# Patient Record
Sex: Male | Born: 2000 | Race: White | Hispanic: No | Marital: Single | Smoking: Never smoker
Health system: Southern US, Community
[De-identification: ages and names within clinical notes are randomized; demographics above are authoritative.]

## PROBLEM LIST (undated history)

## (undated) DIAGNOSIS — L309 Dermatitis, unspecified: Secondary | ICD-10-CM

## (undated) DIAGNOSIS — J45909 Unspecified asthma, uncomplicated: Secondary | ICD-10-CM

## (undated) DIAGNOSIS — J309 Allergic rhinitis, unspecified: Secondary | ICD-10-CM

## (undated) HISTORY — PX: CLEFT LIP REPAIR: SUR1164

## (undated) HISTORY — DX: Dermatitis, unspecified: L30.9

## (undated) HISTORY — DX: Allergic rhinitis, unspecified: J30.9

## (undated) HISTORY — DX: Unspecified asthma, uncomplicated: J45.909

---
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
## 2022-11-02 ENCOUNTER — Encounter (HOSPITAL_COMMUNITY): Payer: Self-pay | Admitting: Emergency Medicine

## 2022-11-02 ENCOUNTER — Ambulatory Visit (INDEPENDENT_AMBULATORY_CARE_PROVIDER_SITE_OTHER): Payer: Managed Care, Other (non HMO)

## 2022-11-02 ENCOUNTER — Ambulatory Visit (HOSPITAL_COMMUNITY)
Admission: EM | Admit: 2022-11-02 | Discharge: 2022-11-02 | Disposition: A | Discharge status: Home / Self Care | Payer: Managed Care, Other (non HMO) | Attending: Physician Assistant | Admitting: Physician Assistant

## 2022-11-02 DIAGNOSIS — J40 Bronchitis, not specified as acute or chronic: Secondary | ICD-10-CM | POA: Diagnosis not present

## 2022-11-02 MED ORDER — PROMETHAZINE-DM 6.25-15 MG/5ML PO SYRP: 5.0000 mL | ORAL_SOLUTION | Freq: Four times a day (QID) | ORAL | 0 refills | Status: AC | PRN

## 2022-11-02 MED ORDER — ALBUTEROL SULFATE HFA 108 (90 BASE) MCG/ACT IN AERS: 1.0000 | INHALATION_SPRAY | Freq: Four times a day (QID) | RESPIRATORY_TRACT | 0 refills | Status: AC | PRN

## 2022-11-02 NOTE — Discharge Instructions (Addendum)
Your chest xray is normal Use cough syrup as needed Use inhaler as needed If you develop new or worsening symptoms please return for evaluation.

## 2022-11-02 NOTE — ED Triage Notes (Signed)
Pt reports that he has cough for a couple weeks. Phlegm is white to yellowish. Reports on Wednesday coughed up bright red blood in phlegm. Denies taking medications for cough.

## 2022-11-02 NOTE — ED Provider Notes (Signed)
[facility]    CSN: [id number] Arrival date & time: 11/02/22  1330      History   Chief Complaint Chief Complaint  Patient presents with   Cough    HPI Jesse Marsh is a 22 y.o. male.   Pt complains of productive cough that started about 2 weeks ago.  Denies fever, chills, night sweats.  He reports several days ago he had blood tinged sputum.  He does vape, has stopped since he saw blood in sputum.  He has a h/o asthma.  Reports some wheezing, but is out of his inhaler at this time.  Denies taking anything else for cough. Denies sick contacts.     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 Authorizing Provider  albuterol (VENTOLIN HFA) 108 (90 Base) MCG/ACT inhaler Inhale 1-2 puffs into the lungs every 6 (six) hours as needed for wheezing or shortness of breath. 11/02/22  Yes Ward, Tylene Fantasia, PA-C  promethazine-dextromethorphan (PROMETHAZINE-DM) 6.25-15 MG/5ML syrup Take 5 mLs by mouth 4 (four) times daily as needed for cough. 11/02/22  Yes Ward, Tylene Fantasia, PA-C  aspirin-acetaminophen-caffeine (EXCEDRIN MIGRAINE) [id number] MG tablet Take 1 tablet by mouth every 6 (six) hours as needed for headache.    [provider]  beclomethasone (QVAR REDIHALER) 80 MCG/ACT inhaler Inhale 2 puffs into the lungs 2 (two) times daily. 02/01/19   Hilts, Casimiro Needle, MD   benzonatate (TESSALON) 100 MG capsule Take 1 capsule (100 mg total) by mouth every 8 (eight) hours. 02/24/20   Hall-Potvin, [place], PA-C  cetirizine (ZYRTEC) 10 MG tablet TAKE 1 TABLET (10 MG TOTAL) BY MOUTH DAILY AS NEEDED. 01/15/16   Kozlow, Alvira Philips, MD  ondansetron (ZOFRAN) 4 MG tablet Take 1 tablet (4 mg total) by mouth every 8 (eight) hours as needed for nausea or vomiting. 03/06/20   Eustaquio Boyden, MD    Family History Family History  Problem Relation Age of Onset   Eczema Mother    Asthma Father    Diabetes Maternal Grandmother    Asthma Maternal Grandmother    Diabetes Paternal Grandmother    Prostate cancer Paternal Grandfather    CAD Neg Hx    Stroke Neg Hx     Social History Social History   Tobacco Use   Smoking status: Never   Smokeless tobacco: Never  Vaping Use   Vaping Use: Never used  Substance Use Topics   Alcohol use: No   Drug use: No     Allergies   Patient has no known allergies.   Review of Systems Review of Systems  Constitutional:  Negative for chills and fever.  HENT:  Negative for ear pain and sore throat.   Eyes:  Negative for pain and visual disturbance.  Respiratory:  Positive for cough and wheezing. Negative for shortness of breath.   Cardiovascular:  Negative for chest pain and palpitations.  Gastrointestinal:  Negative for abdominal pain and vomiting.  Genitourinary:  Negative for dysuria and hematuria.  Musculoskeletal:  Negative for arthralgias and back pain.  Skin:  Negative for color change and rash.  Neurological:  Negative for seizures and syncope.  All other systems reviewed and are negative.    Physical Exam Triage Vital Signs ED Triage Vitals [11/02/22 1420]  Enc Vitals Group     BP 110/73     Pulse Rate (!) 57     Resp 14     Temp 98.8 F (37.1 C)     Temp Source Oral     SpO2 97 %     Weight      Height      Head Circumference      Peak Flow      Pain Score 0     Pain Loc      Pain Edu?      Excl.  in GC?    No data found.  Updated Vital Signs BP 110/73 (BP Location: Left Arm)   Pulse (!) 57   Temp 98.8 F (37.1 C) (Oral)   Resp 14   SpO2 97%   Visual Acuity Right Eye Distance:   Left Eye Distance:   Bilateral Distance:    Right Eye Near:   Left Eye Near:    Bilateral Near:     Physical Exam Vitals and nursing note reviewed.  Constitutional:      General: He is not in acute distress.    Appearance: He is well-developed.  HENT:     Head: Normocephalic and atraumatic.  Eyes:     Conjunctiva/sclera: Conjunctivae normal.  Cardiovascular:     Rate and Rhythm: Normal rate and regular rhythm.     Heart sounds: No murmur heard. Pulmonary:     Effort: Pulmonary effort is normal. No respiratory distress.     Breath sounds: Normal breath sounds.  Abdominal:     Palpations: Abdomen is soft.     Tenderness: There is no abdominal tenderness.  Musculoskeletal:        General: No swelling.     Cervical back: Neck supple.  Skin:    General: Skin is warm and dry.     Capillary Refill: Capillary refill takes less than 2 seconds.  Neurological:     Mental Status: He is alert.  Psychiatric:        Mood and Affect: Mood normal.      UC Treatments / Results  Labs (all labs ordered are listed, but only abnormal results are displayed) Labs Reviewed - No data to display  EKG   Radiology DG Chest 2 View  Result Date: 11/02/2022 CLINICAL DATA:  Wheezing, cough EXAM: CHEST - 2 VIEW COMPARISON:  None Available. FINDINGS: The heart size and mediastinal contours are within normal limits. No focal airspace consolidation, pleural effusion, or pneumothorax. The visualized skeletal structures are unremarkable. IMPRESSION: No active cardiopulmonary disease. Electronically Signed   By: Duanne Guess D.O.   On: 11/02/2022 15:23    Procedures Procedures (including critical care time)  Medications Ordered in UC Medications - No data to display  Initial Impression / Assessment  and Plan / UC Course  I have reviewed the triage vital signs and the nursing notes.  Pertinent labs & imaging results that were available during my care of the patient were reviewed by me and considered in my medical decision making (see chart for details).  Bronchitis, chest xray negative.  Pt overall well appearing, in no acute distress.  Supportive care discussed. Will refill inhaler today.  Return precautions discussed.  Final Clinical Impressions(s) / UC Diagnoses   Final diagnoses:  Bronchitis     Discharge Instructions      Your chest xray is normal Use cough syrup as needed Use inhaler as needed If you develop new or worsening symptoms please return for evaluation.    ED Prescriptions     Medication Sig Dispense Auth. Provider   albuterol (VENTOLIN HFA) 108 (90 Base) MCG/ACT inhaler Inhale 1-2 puffs into the lungs every 6 (six) hours as needed for wheezing or shortness of breath. 1 each Ward, Tylene Fantasia, PA-C   promethazine-dextromethorphan (PROMETHAZINE-DM) 6.25-15 MG/5ML syrup Take 5 mLs by mouth 4 (four) times daily as needed for cough. 118 mL Ward, Tylene Fantasia, PA-C      PDMP not reviewed this encounter.   Ward, Tylene Fantasia, PA-C 11/02/22 1557

## 2023-01-07 ENCOUNTER — Telehealth: Payer: Self-pay | Admitting: Family Medicine

## 2023-01-07 NOTE — Telephone Encounter (Signed)
Spoke with patient and he would like to continue care with the provider but he is at work and will call back to schedule annual physical

## 2023-09-04 ENCOUNTER — Emergency Department
Admission: EM | Admit: 2023-09-04 | Discharge: 2023-09-05 | Disposition: A | Discharge status: Home / Self Care | Attending: Emergency Medicine | Admitting: Emergency Medicine

## 2023-09-04 ENCOUNTER — Other Ambulatory Visit: Payer: Self-pay

## 2023-09-04 ENCOUNTER — Emergency Department

## 2023-09-04 DIAGNOSIS — S01412A Laceration without foreign body of left cheek and temporomandibular area, initial encounter: Secondary | ICD-10-CM | POA: Diagnosis not present

## 2023-09-04 DIAGNOSIS — Y92410 Unspecified street and highway as the place of occurrence of the external cause: Secondary | ICD-10-CM | POA: Diagnosis not present

## 2023-09-04 DIAGNOSIS — S99911A Unspecified injury of right ankle, initial encounter: Secondary | ICD-10-CM | POA: Diagnosis present

## 2023-09-04 DIAGNOSIS — S92151A Displaced avulsion fracture (chip fracture) of right talus, initial encounter for closed fracture: Secondary | ICD-10-CM

## 2023-09-04 DIAGNOSIS — S92191A Other fracture of right talus, initial encounter for closed fracture: Secondary | ICD-10-CM | POA: Diagnosis not present

## 2023-09-04 DIAGNOSIS — S0181XA Laceration without foreign body of other part of head, initial encounter: Secondary | ICD-10-CM

## 2023-09-04 LAB — SAMPLE TO BLOOD BANK
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
## 2023-09-05 DIAGNOSIS — S92191A Other fracture of right talus, initial encounter for closed fracture: Secondary | ICD-10-CM | POA: Diagnosis not present

## 2023-09-05 MED ORDER — LIDOCAINE-EPINEPHRINE-TETRACAINE (LET) TOPICAL GEL
3.0000 mL | Freq: Once | TOPICAL | Status: AC
  Administered 2023-09-05: 3 mL via TOPICAL
  Filled 2023-09-05: qty 3

## 2023-09-05 MED ORDER — FENTANYL CITRATE PF 50 MCG/ML IJ SOSY
50.0000 ug | PREFILLED_SYRINGE | Freq: Once | INTRAMUSCULAR | Status: AC
  Administered 2023-09-05: 50 ug via INTRAVENOUS
  Filled 2023-09-05: qty 1

## 2023-09-05 MED ORDER — OXYCODONE-ACETAMINOPHEN 5-325 MG PO TABS
1.0000 | ORAL_TABLET | Freq: Once | ORAL | Status: AC
  Administered 2023-09-05: 1 via ORAL
  Filled 2023-09-05: qty 1

## 2023-09-05 MED ORDER — OXYCODONE-ACETAMINOPHEN 5-325 MG PO TABS: 1.0000 | ORAL_TABLET | Freq: Four times a day (QID) | ORAL | 0 refills | Status: AC | PRN
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
